# Patient Record
Sex: Female | Born: 1955 | Race: Black or African American | Hispanic: No | Marital: Married | Smoking: Former smoker
Health system: Southern US, Community
[De-identification: ages and names within clinical notes are randomized; demographics above are authoritative.]

## PROBLEM LIST (undated history)

## (undated) DIAGNOSIS — I10 Essential (primary) hypertension: Secondary | ICD-10-CM

## (undated) HISTORY — PX: APPENDECTOMY: SHX54

---

## 2003-01-05 ENCOUNTER — Encounter: Admission: RE | Admit: 2003-01-05 | Discharge: 2003-01-05 | Payer: Self-pay | Admitting: Family Medicine

## 2003-01-05 ENCOUNTER — Encounter: Payer: Self-pay | Admitting: Family Medicine

## 2005-06-12 ENCOUNTER — Emergency Department (HOSPITAL_COMMUNITY): Admission: EM | Admit: 2005-06-12 | Discharge: 2005-06-12 | Payer: Self-pay | Admitting: Emergency Medicine

## 2005-06-13 ENCOUNTER — Ambulatory Visit (HOSPITAL_COMMUNITY): Admission: RE | Admit: 2005-06-13 | Discharge: 2005-06-13 | Payer: Self-pay | Admitting: Emergency Medicine

## 2013-07-10 ENCOUNTER — Other Ambulatory Visit: Payer: Self-pay | Admitting: Obstetrics and Gynecology

## 2013-07-10 DIAGNOSIS — Z1231 Encounter for screening mammogram for malignant neoplasm of breast: Secondary | ICD-10-CM

## 2013-07-16 ENCOUNTER — Other Ambulatory Visit: Payer: Self-pay | Admitting: Gastroenterology

## 2013-07-16 DIAGNOSIS — R1310 Dysphagia, unspecified: Secondary | ICD-10-CM

## 2013-07-18 ENCOUNTER — Other Ambulatory Visit (HOSPITAL_COMMUNITY): Payer: Self-pay | Admitting: Diagnostic Radiology

## 2013-07-21 ENCOUNTER — Ambulatory Visit
Admission: RE | Admit: 2013-07-21 | Discharge: 2013-07-21 | Disposition: A | Discharge status: Home / Self Care | Payer: BC Managed Care – PPO | Source: Ambulatory Visit | Attending: Gastroenterology | Admitting: Gastroenterology

## 2013-07-21 DIAGNOSIS — R1310 Dysphagia, unspecified: Secondary | ICD-10-CM

## 2013-07-25 ENCOUNTER — Ambulatory Visit
Admission: RE | Admit: 2013-07-25 | Discharge: 2013-07-25 | Disposition: A | Discharge status: Home / Self Care | Payer: BC Managed Care – PPO | Source: Ambulatory Visit | Attending: Obstetrics and Gynecology | Admitting: Obstetrics and Gynecology

## 2013-07-25 DIAGNOSIS — Z1231 Encounter for screening mammogram for malignant neoplasm of breast: Secondary | ICD-10-CM

## 2013-10-13 ENCOUNTER — Encounter (HOSPITAL_COMMUNITY): Payer: Self-pay | Admitting: Pharmacist

## 2013-10-14 ENCOUNTER — Other Ambulatory Visit: Payer: Self-pay | Admitting: Obstetrics and Gynecology

## 2013-10-21 ENCOUNTER — Encounter (INDEPENDENT_AMBULATORY_CARE_PROVIDER_SITE_OTHER): Payer: Self-pay

## 2013-10-21 ENCOUNTER — Encounter (HOSPITAL_COMMUNITY): Payer: Self-pay

## 2013-10-21 ENCOUNTER — Encounter (HOSPITAL_COMMUNITY)
Admission: RE | Admit: 2013-10-21 | Discharge: 2013-10-21 | Disposition: A | Discharge status: Home / Self Care | Payer: BC Managed Care – PPO | Source: Ambulatory Visit | Attending: Obstetrics and Gynecology | Admitting: Obstetrics and Gynecology

## 2013-10-21 ENCOUNTER — Other Ambulatory Visit: Payer: Self-pay

## 2013-10-21 DIAGNOSIS — Z01810 Encounter for preprocedural cardiovascular examination: Secondary | ICD-10-CM | POA: Insufficient documentation

## 2013-10-21 DIAGNOSIS — Z01818 Encounter for other preprocedural examination: Secondary | ICD-10-CM | POA: Insufficient documentation

## 2013-10-21 DIAGNOSIS — Z01812 Encounter for preprocedural laboratory examination: Secondary | ICD-10-CM | POA: Insufficient documentation

## 2013-10-21 HISTORY — DX: Essential (primary) hypertension: I10

## 2013-10-21 LAB — BASIC METABOLIC PANEL
BUN: 11 mg/dL (ref 6–23)
CO2: 30 mEq/L (ref 19–32)
Chloride: 101 mEq/L (ref 96–112)
Glucose, Bld: 87 mg/dL (ref 70–99)

## 2013-10-21 LAB — CBC
HCT: 37.8 % (ref 36.0–46.0)
Hemoglobin: 12.8 g/dL (ref 12.0–15.0)
MCH: 27.5 pg (ref 26.0–34.0)
MCHC: 33.9 g/dL (ref 30.0–36.0)
MCV: 81.3 fL (ref 78.0–100.0)
RBC: 4.65 MIL/uL (ref 3.87–5.11)

## 2013-10-21 NOTE — Patient Instructions (Signed)
Your procedure is scheduled on:10/29/13  Enter through the Main Entrance at :10 am Pick up desk phone and dial [phone] and inform us of your arrival.  Please call [phone] if you have any problems the morning of surgery.  Remember: Do not eat food or drink liquids, including water, after midnight:Tuesday   You may brush your teeth the morning of surgery.   DO NOT wear jewelry, eye make-up, lipstick,body lotion, or dark fingernail polish.  (Polished toes are ok) You may wear deodorant.  If you are to be admitted after surgery, leave suitcase in car until your room has been assigned. Patients discharged on the day of surgery will not be allowed to drive home. Wear loose fitting, comfortable clothes for your ride home.

## 2013-10-27 NOTE — H&P (Signed)
  Admission History and Physical Exam for a Gynecology Patient  Ms. Alice Williamson is a 57 y.o. female who presents for hysteroscopy with resection of submucosal fibroid. She will also have a dilatation and curettage. She has been followed at the [facility]cology division of [facility]. The patient complains of postmenopausal bleeding. An endometrial biopsy was benign. Endometrial polyps were seen. A sono hysterogram showed a submucosal fibroid.   Past Medical History  Diagnosis Date  . Hypertension     No prescriptions prior to admission    Past Surgical History  Procedure Laterality Date  . Appendectomy      Allergies  Allergen Reactions  . Codeine Nausea And Vomiting    Family History: family history is not on file.  Social History:  reports that she has never smoked. She does not have any smokeless tobacco history on file. She reports that she drinks alcohol. She reports that she does not use illicit drugs.  Review of systems: See HPI.  Admission Physical Exam:    There is no height or weight on file to calculate BMI.  There were no vitals taken for this visit.  HEENT:                 Within normal limits Chest:                   Clear Heart:                    Regular rate and rhythm Breasts:                No masses, skin changes, bleeding, or discharge present Abdomen:             Nontender, no masses Extremities:          Grossly normal Neurologic exam: Grossly normal  Pelvic exam:  External genitalia: normal general appearance Vaginal: normal without tenderness, induration or masses Cervix: normal appearance Adnexa: normal bimanual exam Uterus: Upper limits normal size Rectal: no masses  Assessment:  Postmenopausal bleeding  Endometrial polyps  Submucosal fibroid  Hypertension  Plan:  The patient will undergo hysteroscopy with resection of submucosal fibroid. She will also have a dilatation and  curettage. She understands the indications for surgical procedure. She accepts the risk of, but not limited to, anesthetic complications, bleeding, infections, and possible damage to the surrounding organs.   Janine Limbo 10/27/2013

## 2013-10-29 ENCOUNTER — Encounter (HOSPITAL_COMMUNITY)
Admission: RE | Disposition: A | Discharge status: Home / Self Care | Payer: Self-pay | Source: Ambulatory Visit | Attending: Obstetrics and Gynecology

## 2013-10-29 ENCOUNTER — Ambulatory Visit (HOSPITAL_COMMUNITY)
Admission: RE | Admit: 2013-10-29 | Discharge: 2013-10-29 | Disposition: A | Discharge status: Home / Self Care | Payer: BC Managed Care – PPO | Source: Ambulatory Visit | Attending: Obstetrics and Gynecology | Admitting: Obstetrics and Gynecology

## 2013-10-29 ENCOUNTER — Encounter (HOSPITAL_COMMUNITY): Payer: BC Managed Care – PPO | Admitting: Anesthesiology

## 2013-10-29 ENCOUNTER — Encounter (HOSPITAL_COMMUNITY): Payer: Self-pay | Admitting: Registered Nurse

## 2013-10-29 ENCOUNTER — Ambulatory Visit (HOSPITAL_COMMUNITY): Payer: BC Managed Care – PPO | Admitting: Anesthesiology

## 2013-10-29 DIAGNOSIS — I10 Essential (primary) hypertension: Secondary | ICD-10-CM | POA: Insufficient documentation

## 2013-10-29 DIAGNOSIS — N840 Polyp of corpus uteri: Secondary | ICD-10-CM | POA: Insufficient documentation

## 2013-10-29 DIAGNOSIS — E669 Obesity, unspecified: Secondary | ICD-10-CM | POA: Insufficient documentation

## 2013-10-29 DIAGNOSIS — K0889 Other specified disorders of teeth and supporting structures: Secondary | ICD-10-CM | POA: Insufficient documentation

## 2013-10-29 DIAGNOSIS — D250 Submucous leiomyoma of uterus: Secondary | ICD-10-CM | POA: Insufficient documentation

## 2013-10-29 DIAGNOSIS — N950 Postmenopausal bleeding: Secondary | ICD-10-CM | POA: Insufficient documentation

## 2013-10-29 HISTORY — PX: DILATATION & CURRETTAGE/HYSTEROSCOPY WITH RESECTOCOPE: SHX5572

## 2013-10-29 SURGERY — DILATATION & CURETTAGE/HYSTEROSCOPY WITH RESECTOCOPE
Anesthesia: Spinal | Site: Vagina | Wound class: Clean Contaminated

## 2013-10-29 MED ORDER — IBUPROFEN 800 MG PO TABS: 800.0000 mg | ORAL_TABLET | Freq: Three times a day (TID) | ORAL | Status: AC | PRN

## 2013-10-29 MED ORDER — TRAMADOL HCL 50 MG PO TABS: 50.0000 mg | ORAL_TABLET | Freq: Four times a day (QID) | ORAL | Status: DC | PRN

## 2013-10-29 MED ORDER — FENTANYL CITRATE 0.05 MG/ML IJ SOLN
25.0000 ug | INTRAMUSCULAR | Status: DC | PRN
  Administered 2013-10-29: 50 ug via INTRAVENOUS
  Administered 2013-10-29: 25 ug via INTRAVENOUS

## 2013-10-29 MED ORDER — PROPOFOL 10 MG/ML IV EMUL
INTRAVENOUS | Status: AC
  Filled 2013-10-29: qty 20

## 2013-10-29 MED ORDER — FENTANYL CITRATE 0.05 MG/ML IJ SOLN
INTRAMUSCULAR | Status: AC
  Filled 2013-10-29: qty 2

## 2013-10-29 MED ORDER — BUPIVACAINE-EPINEPHRINE (PF) 0.5% -1:200000 IJ SOLN
INTRAMUSCULAR | Status: AC
  Filled 2013-10-29: qty 10

## 2013-10-29 MED ORDER — DEXAMETHASONE SODIUM PHOSPHATE 10 MG/ML IJ SOLN
INTRAMUSCULAR | Status: AC
  Filled 2013-10-29: qty 1

## 2013-10-29 MED ORDER — PHENYLEPHRINE HCL 10 MG/ML IJ SOLN
INTRAMUSCULAR | Status: DC | PRN
  Administered 2013-10-29 (×2): 80 ug via INTRAVENOUS

## 2013-10-29 MED ORDER — LIDOCAINE HCL (CARDIAC) 20 MG/ML IV SOLN
INTRAVENOUS | Status: AC
  Filled 2013-10-29: qty 5

## 2013-10-29 MED ORDER — LIDOCAINE HCL (CARDIAC) 20 MG/ML IV SOLN
INTRAVENOUS | Status: DC | PRN
  Administered 2013-10-29: 30 mg via INTRAVENOUS

## 2013-10-29 MED ORDER — PROPOFOL INFUSION 10 MG/ML OPTIME
INTRAVENOUS | Status: DC | PRN
  Administered 2013-10-29: 50 ug/kg/min via INTRAVENOUS

## 2013-10-29 MED ORDER — LIDOCAINE IN DEXTROSE 5-7.5 % IV SOLN
INTRAVENOUS | Status: AC
  Filled 2013-10-29: qty 2

## 2013-10-29 MED ORDER — LACTATED RINGERS IV SOLN
INTRAVENOUS | Status: DC
  Administered 2013-10-29 (×2): via INTRAVENOUS

## 2013-10-29 MED ORDER — PHENYLEPHRINE 40 MCG/ML (10ML) SYRINGE FOR IV PUSH (FOR BLOOD PRESSURE SUPPORT)
PREFILLED_SYRINGE | INTRAVENOUS | Status: AC
  Filled 2013-10-29: qty 5

## 2013-10-29 MED ORDER — ONDANSETRON HCL 4 MG/2ML IJ SOLN
INTRAMUSCULAR | Status: DC | PRN
  Administered 2013-10-29: 4 mg via INTRAVENOUS

## 2013-10-29 MED ORDER — MIDAZOLAM HCL 2 MG/2ML IJ SOLN
INTRAMUSCULAR | Status: AC
  Filled 2013-10-29: qty 2

## 2013-10-29 MED ORDER — MIDAZOLAM HCL 5 MG/5ML IJ SOLN
INTRAMUSCULAR | Status: DC | PRN
  Administered 2013-10-29: 2 mg via INTRAVENOUS

## 2013-10-29 MED ORDER — EPHEDRINE 5 MG/ML INJ
INTRAVENOUS | Status: AC
  Filled 2013-10-29: qty 10

## 2013-10-29 MED ORDER — KETOROLAC TROMETHAMINE 30 MG/ML IJ SOLN
INTRAMUSCULAR | Status: AC
  Filled 2013-10-29: qty 2

## 2013-10-29 MED ORDER — GLYCINE 1.5 % IR SOLN
Status: DC | PRN
  Administered 2013-10-29: 3000 mL

## 2013-10-29 MED ORDER — DEXAMETHASONE SODIUM PHOSPHATE 10 MG/ML IJ SOLN
INTRAMUSCULAR | Status: DC | PRN
  Administered 2013-10-29: 10 mg via INTRAVENOUS

## 2013-10-29 MED ORDER — EPHEDRINE SULFATE 50 MG/ML IJ SOLN
INTRAMUSCULAR | Status: DC | PRN
  Administered 2013-10-29: 10 mg via INTRAVENOUS

## 2013-10-29 MED ORDER — ONDANSETRON HCL 4 MG/2ML IJ SOLN
INTRAMUSCULAR | Status: AC
Start: 2013-10-29 — End: 2013-10-29
  Filled 2013-10-29: qty 2

## 2013-10-29 MED ORDER — FENTANYL CITRATE 0.05 MG/ML IJ SOLN
INTRAMUSCULAR | Status: AC
  Administered 2013-10-29: 25 ug via INTRAVENOUS
  Filled 2013-10-29: qty 2

## 2013-10-29 MED ORDER — KETOROLAC TROMETHAMINE 30 MG/ML IJ SOLN
INTRAMUSCULAR | Status: DC | PRN
  Administered 2013-10-29: 30 mg via INTRAMUSCULAR
  Administered 2013-10-29: 30 mg via INTRAVENOUS

## 2013-10-29 MED ORDER — FENTANYL CITRATE 0.05 MG/ML IJ SOLN
INTRAMUSCULAR | Status: DC | PRN
  Administered 2013-10-29 (×2): 50 ug via INTRAVENOUS

## 2013-10-29 SURGICAL SUPPLY — 19 items
CANISTER SUCT 3000ML (MISCELLANEOUS) ×2 IMPLANT
CATH ROBINSON RED A/P 16FR (CATHETERS) ×2 IMPLANT
CLOTH BEACON ORANGE TIMEOUT ST (SAFETY) ×2 IMPLANT
CONTAINER PREFILL 10% NBF 60ML (FORM) ×3 IMPLANT
DRAPE HYSTEROSCOPY (DRAPE) ×1 IMPLANT
DRAPE PROXIMA HALF (DRAPES) ×1 IMPLANT
DRESSING TELFA 8X3 (GAUZE/BANDAGES/DRESSINGS) ×2 IMPLANT
ELECT REM PT RETURN 9FT ADLT (ELECTROSURGICAL) ×2
ELECTRODE REM PT RTRN 9FT ADLT (ELECTROSURGICAL) IMPLANT
GLOVE BIOGEL PI IND STRL 8.5 (GLOVE) ×1 IMPLANT
GLOVE BIOGEL PI INDICATOR 8.5 (GLOVE) ×1
GLOVE ECLIPSE 8.0 STRL XLNG CF (GLOVE) ×4 IMPLANT
GOWN STRL REIN XL XLG (GOWN DISPOSABLE) ×4 IMPLANT
LOOP ANGLED CUTTING 22FR (CUTTING LOOP) IMPLANT
PACK HYSTEROSCOPY LF (CUSTOM PROCEDURE TRAY) ×1 IMPLANT
PACK VAGINAL MINOR WOMEN LF (CUSTOM PROCEDURE TRAY) ×1 IMPLANT
PAD OB MATERNITY 4.3X12.25 (PERSONAL CARE ITEMS) ×2 IMPLANT
TOWEL OR 17X24 6PK STRL BLUE (TOWEL DISPOSABLE) ×4 IMPLANT
WATER STERILE IRR 1000ML POUR (IV SOLUTION) ×2 IMPLANT

## 2013-10-29 NOTE — Preoperative (Signed)
Beta Blockers   Reason not to administer Beta Blockers:Not Applicable 

## 2013-10-29 NOTE — Anesthesia Postprocedure Evaluation (Signed)
  Anesthesia Post-op Note  Patient: Alice Williamson  Procedure(s) Performed: Procedure(s): DILATATION & CURETTAGE/HYSTEROSCOPY WITH RESECTION OF POLYP AND FIBROID (N/A)  Patient is awake, responsive, moving her legs, and has signs of resolution of her numbness. Pain and nausea are reasonably well controlled. Vital signs are stable and clinically acceptable. Oxygen saturation is clinically acceptable. There are no apparent anesthetic complications at this time. Patient is ready for discharge.

## 2013-10-29 NOTE — Op Note (Signed)
OPERATIVE NOTE  Alice Williamson  DOB:    [birth year]  MRN:    [id number]  CSN:    [id number]  Date of Surgery:  10/29/2013  Preoperative Diagnosis:  Postmenopausal bleeding  Submucosal polyp  Submucosal fibroid  Overweight  Loose tooth  Postoperative Diagnosis:  Same  Procedure:  Hysteroscopy with resection of endometrial polyps and submucosal fibroid Dilatation and curettage  Surgeon:  Leonard Schwartz, M.D.  Assistant:  None  Anesthetic:  Spinal (because of loose tooth)  Disposition:  The patient is a 57 y.o.-year-old female who presents with the above-mentioned diagnosis. She understands the indications for her surgical procedure. She accepts the risk of, but not limited to, anesthetic complications, bleeding, infections, and possible damage to the surrounding organs.  Findings:  On examination under anesthesia the uterus was upper limits normal size size. No adnexal masses were appreciated. No parametrial disease was appreciated. The uterus sounded to 6 cm. The patient was noted to have an endometrial polyp at the fundus of the uterus. She was also noted to have a submucosal fibroid measuring approximately 1 cm from the left lateral inner portion of the uterus.  Procedure:  The patient was taken to the operating room where a spinal anesthetic was given. The perineum and vagina were prepped with Betadine. The bladder was drained of urine. The patient was sterilely draped. Examination under anesthesia was performed. An endocervical curettage was performed. The cervix was gently dilated. The diagnostic hysteroscope was inserted and the cavity was carefully inspected. Pictures were taken. Findings included: An endometrial polyp and a submucosal fibroid. The diagnostic hysteroscope was removed. The cervix was dilated further. The operative hysteroscope was inserted. The polyp and submucosal fibroid were resected using a single loop. The cavity was then curetted  using a sharp curet. The cavity was felt to be clean at the end of our procedure. Hemostasis was adequate. All instruments were removed. The examination was repeated and the uterus was noted to be firm. Sponge, and needle counts were correct. The estimated blood loss for the procedure was 10 cc's. The estimated fluid deficit loss 470 cc. The patient was awakened from her anesthetic without difficulty. She was returned to the supine position and and transported to the recovery room in stable condition. The endocervical curettings, endometrial resections, and endometrial curettings were sent to pathology.  Followup instructions:  The patient will return to see Dr. Stefano Gaul in 2 weeks. She was given a copy of the postoperative instructions for patients who've undergone hysteroscopy.  Discharge medications:  Motrin 800 mg every 8 hours as needed for mild to moderate pain. Tramadol 50 mg tablet every 4 hours as needed for severe pain.  Leonard Schwartz, M.D.

## 2013-10-29 NOTE — Anesthesia Preprocedure Evaluation (Addendum)
Anesthesia Evaluation  Patient identified by MRN, date of birth, ID band Patient awake    Reviewed: Allergy & Precautions, H&P , Patient's Chart, lab work & pertinent test results, reviewed documented beta blocker date and time   Airway Mallampati: II TM Distance: >3 FB Neck ROM: full    Dental no notable dental hx. (+) Loose,    Pulmonary  breath sounds clear to auscultation  Pulmonary exam normal       Cardiovascular hypertension, On Medications Rhythm:regular Rate:Normal     Neuro/Psych    GI/Hepatic   Endo/Other    Renal/GU      Musculoskeletal   Abdominal   Peds  Hematology   Anesthesia Other Findings   Reproductive/Obstetrics                          Anesthesia Physical Anesthesia Plan  ASA: II  Anesthesia Plan: Spinal   Post-op Pain Management:    Induction:   Airway Management Planned:   Additional Equipment:   Intra-op Plan:   Post-operative Plan:   Informed Consent: I have reviewed the patients History and Physical, chart, labs and discussed the procedure including the risks, benefits and alternatives for the proposed anesthesia with the patient or authorized representative who has indicated his/her understanding and acceptance.   Dental Advisory Given and Dental advisory given  Plan Discussed with: CRNA and Surgeon  Anesthesia Plan Comments: (Lab work confirmed with CRNA in room. Platelets okay. Discussed spinal anesthetic, and patient consents to the procedure:  included risk of possible headache,backache, failed block, allergic reaction, and nerve injury. This patient was asked if she had any questions or concerns before the procedure started. )       Anesthesia Quick Evaluation

## 2013-10-29 NOTE — H&P (Signed)
The patient was interviewed and examined today.  The previously documented history and physical examination was reviewed. There are no changes. The operative procedure was reviewed. The risks and benefits were outlined again. The specific risks include, but are not limited to, anesthetic complications, bleeding, infections, and possible damage to the surrounding organs. The patient's questions were answered.  We are ready to proceed as outlined. The likelihood of the patient achieving the goals of this procedure is very likely.   BP 112/70  Pulse 62  Temp(Src) 97.9 F (36.6 C) (Oral)  Resp 16  SpO2 100%  CBC    Component Value Date/Time   WBC 5.0 10/21/2013 1220   RBC 4.65 10/21/2013 1220   HGB 12.8 10/21/2013 1220   HCT 37.8 10/21/2013 1220   PLT 260 10/21/2013 1220   MCV 81.3 10/21/2013 1220   MCH 27.5 10/21/2013 1220   MCHC 33.9 10/21/2013 1220   RDW 13.1 10/21/2013 1220   Leonard Schwartz, M.D.

## 2013-10-29 NOTE — Transfer of Care (Signed)
Immediate Anesthesia Transfer of Care Note  Patient: Alice Williamson  Procedure(s) Performed: Procedure(s): DILATATION & CURETTAGE/HYSTEROSCOPY WITH RESECTION OF POLYP AND FIBROID (N/A)  Patient Location: PACU  Anesthesia Type:Spinal  Level of Consciousness: awake, alert , oriented and patient cooperative  Airway & Oxygen Therapy: Patient Spontanous Breathing  Post-op Assessment: Report given to PACU RN and Post -op Vital signs reviewed and stable  Post vital signs: stable  Complications: No apparent anesthesia complications

## 2013-10-29 NOTE — Anesthesia Procedure Notes (Signed)
Spinal  Patient location during procedure: OR Preanesthetic Checklist Completed: patient identified, site marked, surgical consent, pre-op evaluation, timeout performed, IV checked, risks and benefits discussed and monitors and equipment checked Spinal Block Patient position: sitting Prep: DuraPrep Patient monitoring: heart rate, cardiac monitor, continuous pulse ox and blood pressure Approach: midline Location: L3-4 Injection technique: single-shot Needle Needle type: Sprotte  Needle gauge: 24 G Needle length: 9 cm Assessment Sensory level: T4 Additional Notes Spinal dose 60 mg Xylocaine

## 2013-10-30 ENCOUNTER — Encounter (HOSPITAL_COMMUNITY): Payer: Self-pay | Admitting: Obstetrics and Gynecology

## 2015-01-04 ENCOUNTER — Other Ambulatory Visit: Payer: Self-pay | Admitting: Orthopedic Surgery

## 2015-01-06 ENCOUNTER — Encounter (HOSPITAL_BASED_OUTPATIENT_CLINIC_OR_DEPARTMENT_OTHER): Payer: Self-pay | Admitting: *Deleted

## 2015-01-06 NOTE — Progress Notes (Signed)
Pt needs to come in for ekg-bmet-she is saying she may r/s due to weather coming

## 2015-01-18 ENCOUNTER — Encounter (HOSPITAL_BASED_OUTPATIENT_CLINIC_OR_DEPARTMENT_OTHER)
Admission: RE | Admit: 2015-01-18 | Discharge: 2015-01-18 | Disposition: A | Discharge status: Home / Self Care | Payer: 59 | Source: Ambulatory Visit | Attending: Orthopedic Surgery | Admitting: Orthopedic Surgery

## 2015-01-18 DIAGNOSIS — M6752 Plica syndrome, left knee: Secondary | ICD-10-CM | POA: Diagnosis not present

## 2015-01-18 DIAGNOSIS — I10 Essential (primary) hypertension: Secondary | ICD-10-CM | POA: Diagnosis not present

## 2015-01-18 DIAGNOSIS — M6751 Plica syndrome, right knee: Secondary | ICD-10-CM | POA: Diagnosis not present

## 2015-01-18 DIAGNOSIS — M94261 Chondromalacia, right knee: Secondary | ICD-10-CM | POA: Diagnosis not present

## 2015-01-18 DIAGNOSIS — M170 Bilateral primary osteoarthritis of knee: Secondary | ICD-10-CM | POA: Diagnosis not present

## 2015-01-18 DIAGNOSIS — M23222 Derangement of posterior horn of medial meniscus due to old tear or injury, left knee: Secondary | ICD-10-CM | POA: Diagnosis not present

## 2015-01-18 DIAGNOSIS — M23200 Derangement of unspecified lateral meniscus due to old tear or injury, right knee: Secondary | ICD-10-CM | POA: Diagnosis not present

## 2015-01-18 LAB — BASIC METABOLIC PANEL
Anion gap: 7 (ref 5–15)
BUN: 11 mg/dL (ref 6–23)
CO2: 28 mmol/L (ref 19–32)
Calcium: 9.5 mg/dL (ref 8.4–10.5)
Chloride: 105 mmol/L (ref 96–112)
Creatinine, Ser: 1.06 mg/dL (ref 0.50–1.10)
GFR calc Af Amer: 66 mL/min — ABNORMAL LOW (ref >90)
GFR, EST NON AFRICAN AMERICAN: 57 mL/min — AB (ref >90)
Glucose, Bld: 97 mg/dL (ref 70–99)
Potassium: 4.7 mmol/L (ref 3.5–5.1)
Sodium: 140 mmol/L (ref 135–145)

## 2015-01-18 NOTE — Progress Notes (Signed)
surg chg due to weather-to bring all meds and ins card To come in for ekg bmet

## 2015-01-20 ENCOUNTER — Ambulatory Visit (HOSPITAL_BASED_OUTPATIENT_CLINIC_OR_DEPARTMENT_OTHER)
Admission: RE | Admit: 2015-01-20 | Discharge: 2015-01-20 | Disposition: A | Discharge status: Home / Self Care | Payer: 59 | Source: Ambulatory Visit | Attending: Orthopedic Surgery | Admitting: Orthopedic Surgery

## 2015-01-20 ENCOUNTER — Encounter (HOSPITAL_BASED_OUTPATIENT_CLINIC_OR_DEPARTMENT_OTHER)
Admission: RE | Disposition: A | Discharge status: Home / Self Care | Payer: Self-pay | Source: Ambulatory Visit | Attending: Orthopedic Surgery

## 2015-01-20 ENCOUNTER — Ambulatory Visit (HOSPITAL_BASED_OUTPATIENT_CLINIC_OR_DEPARTMENT_OTHER): Payer: 59 | Admitting: Certified Registered"

## 2015-01-20 ENCOUNTER — Encounter (HOSPITAL_BASED_OUTPATIENT_CLINIC_OR_DEPARTMENT_OTHER): Payer: Self-pay | Admitting: *Deleted

## 2015-01-20 DIAGNOSIS — M23200 Derangement of unspecified lateral meniscus due to old tear or injury, right knee: Secondary | ICD-10-CM | POA: Insufficient documentation

## 2015-01-20 DIAGNOSIS — M94261 Chondromalacia, right knee: Secondary | ICD-10-CM | POA: Insufficient documentation

## 2015-01-20 DIAGNOSIS — M170 Bilateral primary osteoarthritis of knee: Secondary | ICD-10-CM | POA: Insufficient documentation

## 2015-01-20 DIAGNOSIS — M23222 Derangement of posterior horn of medial meniscus due to old tear or injury, left knee: Secondary | ICD-10-CM | POA: Insufficient documentation

## 2015-01-20 DIAGNOSIS — M6751 Plica syndrome, right knee: Secondary | ICD-10-CM | POA: Insufficient documentation

## 2015-01-20 DIAGNOSIS — I10 Essential (primary) hypertension: Secondary | ICD-10-CM | POA: Insufficient documentation

## 2015-01-20 DIAGNOSIS — M6752 Plica syndrome, left knee: Secondary | ICD-10-CM | POA: Insufficient documentation

## 2015-01-20 HISTORY — PX: CHONDROPLASTY: SHX5177

## 2015-01-20 HISTORY — PX: KNEE ARTHROSCOPY WITH MEDIAL MENISECTOMY: SHX5651

## 2015-01-20 HISTORY — PX: KNEE ARTHROSCOPY WITH EXCISION PLICA: SHX5647

## 2015-01-20 HISTORY — PX: KNEE ARTHROSCOPY WITH LATERAL MENISECTOMY: SHX6193

## 2015-01-20 LAB — POCT HEMOGLOBIN-HEMACUE: Hemoglobin: 13.1 g/dL (ref 12.0–15.0)

## 2015-01-20 SURGERY — ARTHROSCOPY, KNEE, WITH MEDIAL MENISCECTOMY
Anesthesia: General | Site: Knee | Laterality: Right

## 2015-01-20 MED ORDER — FENTANYL CITRATE 0.05 MG/ML IJ SOLN
INTRAMUSCULAR | Status: AC
  Filled 2015-01-20: qty 6

## 2015-01-20 MED ORDER — PROPOFOL 10 MG/ML IV BOLUS
INTRAVENOUS | Status: DC | PRN
  Administered 2015-01-20: 240 mg via INTRAVENOUS

## 2015-01-20 MED ORDER — MIDAZOLAM HCL 2 MG/2ML IJ SOLN
INTRAMUSCULAR | Status: AC
  Filled 2015-01-20: qty 2

## 2015-01-20 MED ORDER — CEFAZOLIN SODIUM 1-5 GM-% IV SOLN
INTRAVENOUS | Status: AC
  Filled 2015-01-20: qty 100

## 2015-01-20 MED ORDER — FENTANYL CITRATE 0.05 MG/ML IJ SOLN: 50.0000 ug | INTRAMUSCULAR | Status: DC | PRN

## 2015-01-20 MED ORDER — DEXAMETHASONE SODIUM PHOSPHATE 10 MG/ML IJ SOLN
INTRAMUSCULAR | Status: DC | PRN
  Administered 2015-01-20: 10 mg via INTRAVENOUS

## 2015-01-20 MED ORDER — CEFAZOLIN SODIUM-DEXTROSE 2-3 GM-% IV SOLR
2.0000 g | INTRAVENOUS | Status: AC
  Administered 2015-01-20: 2 g via INTRAVENOUS

## 2015-01-20 MED ORDER — HYDROMORPHONE HCL 1 MG/ML IJ SOLN
INTRAMUSCULAR | Status: AC
  Filled 2015-01-20: qty 1

## 2015-01-20 MED ORDER — OXYCODONE-ACETAMINOPHEN 5-325 MG PO TABS: 1.0000 | ORAL_TABLET | ORAL | Status: AC | PRN

## 2015-01-20 MED ORDER — BUPIVACAINE HCL (PF) 0.5 % IJ SOLN
INTRAMUSCULAR | Status: DC | PRN
  Administered 2015-01-20: 20 mL

## 2015-01-20 MED ORDER — OXYCODONE HCL 5 MG PO TABS
ORAL_TABLET | ORAL | Status: AC
  Filled 2015-01-20: qty 1

## 2015-01-20 MED ORDER — LIDOCAINE HCL (CARDIAC) 20 MG/ML IV SOLN
INTRAVENOUS | Status: DC | PRN
  Administered 2015-01-20: 30 mg via INTRAVENOUS

## 2015-01-20 MED ORDER — CHLORHEXIDINE GLUCONATE 4 % EX LIQD: 60.0000 mL | Freq: Once | CUTANEOUS | Status: DC

## 2015-01-20 MED ORDER — LACTATED RINGERS IV SOLN
INTRAVENOUS | Status: DC
  Administered 2015-01-20 (×2): via INTRAVENOUS

## 2015-01-20 MED ORDER — HYDROMORPHONE HCL 1 MG/ML IJ SOLN
0.2500 mg | INTRAMUSCULAR | Status: DC | PRN
  Administered 2015-01-20 (×3): 0.5 mg via INTRAVENOUS

## 2015-01-20 MED ORDER — BUPIVACAINE HCL (PF) 0.5 % IJ SOLN
INTRAMUSCULAR | Status: AC
  Filled 2015-01-20: qty 30

## 2015-01-20 MED ORDER — MIDAZOLAM HCL 2 MG/2ML IJ SOLN: 1.0000 mg | INTRAMUSCULAR | Status: DC | PRN

## 2015-01-20 MED ORDER — OXYCODONE HCL 5 MG PO TABS
5.0000 mg | ORAL_TABLET | Freq: Once | ORAL | Status: AC | PRN
  Administered 2015-01-20: 5 mg via ORAL

## 2015-01-20 MED ORDER — OXYCODONE HCL 5 MG/5ML PO SOLN: 5.0000 mg | Freq: Once | ORAL | Status: AC | PRN

## 2015-01-20 MED ORDER — FENTANYL CITRATE 0.05 MG/ML IJ SOLN
INTRAMUSCULAR | Status: DC | PRN
  Administered 2015-01-20 (×2): 25 ug via INTRAVENOUS
  Administered 2015-01-20: 50 ug via INTRAVENOUS
  Administered 2015-01-20 (×2): 25 ug via INTRAVENOUS

## 2015-01-20 MED ORDER — EPHEDRINE SULFATE 50 MG/ML IJ SOLN
INTRAMUSCULAR | Status: DC | PRN
  Administered 2015-01-20: 10 mg via INTRAVENOUS

## 2015-01-20 MED ORDER — ONDANSETRON HCL 4 MG/2ML IJ SOLN: 4.0000 mg | Freq: Once | INTRAMUSCULAR | Status: DC | PRN

## 2015-01-20 MED ORDER — ONDANSETRON HCL 4 MG/2ML IJ SOLN
INTRAMUSCULAR | Status: DC | PRN
  Administered 2015-01-20: 4 mg via INTRAVENOUS

## 2015-01-20 SURGICAL SUPPLY — 40 items
BANDAGE ELASTIC 6 VELCRO ST LF (GAUZE/BANDAGES/DRESSINGS) ×10 IMPLANT
BLADE 4.2CUDA (BLADE) IMPLANT
BLADE GREAT WHITE 4.2 (BLADE) ×4 IMPLANT
BLADE GREAT WHITE 4.2MM (BLADE) ×1
CUTTER MENISCUS  4.2MM (BLADE)
CUTTER MENISCUS 4.2MM (BLADE) IMPLANT
DRAPE ARTHROSCOPY W/POUCH 114 (DRAPES) ×10 IMPLANT
DRSG EMULSION OIL 3X3 NADH (GAUZE/BANDAGES/DRESSINGS) ×10 IMPLANT
DURAPREP 26ML APPLICATOR (WOUND CARE) ×10 IMPLANT
ELECT MENISCUS 165MM 90D (ELECTRODE) IMPLANT
ELECT REM PT RETURN 9FT ADLT (ELECTROSURGICAL)
ELECTRODE REM PT RTRN 9FT ADLT (ELECTROSURGICAL) IMPLANT
GAUZE SPONGE 4X4 12PLY STRL (GAUZE/BANDAGES/DRESSINGS) ×5 IMPLANT
GLOVE BIO SURGEON STRL SZ7 (GLOVE) ×3 IMPLANT
GLOVE BIOGEL PI IND STRL 7.0 (GLOVE) ×1 IMPLANT
GLOVE BIOGEL PI IND STRL 8 (GLOVE) ×6 IMPLANT
GLOVE BIOGEL PI INDICATOR 7.0 (GLOVE) ×2
GLOVE BIOGEL PI INDICATOR 8 (GLOVE) ×4
GLOVE ECLIPSE 6.5 STRL STRAW (GLOVE) ×6 IMPLANT
GLOVE ECLIPSE 7.5 STRL STRAW (GLOVE) ×10 IMPLANT
GOWN STRL REUS W/ TWL LRG LVL3 (GOWN DISPOSABLE) ×3 IMPLANT
GOWN STRL REUS W/ TWL XL LVL3 (GOWN DISPOSABLE) ×3 IMPLANT
GOWN STRL REUS W/TWL LRG LVL3 (GOWN DISPOSABLE) ×5
GOWN STRL REUS W/TWL XL LVL3 (GOWN DISPOSABLE) ×10 IMPLANT
HOLDER KNEE FOAM BLUE (MISCELLANEOUS) ×4 IMPLANT
KNEE WRAP E Z 3 GEL PACK (MISCELLANEOUS) ×10 IMPLANT
MANIFOLD NEPTUNE II (INSTRUMENTS) IMPLANT
NDL SAFETY ECLIPSE 18X1.5 (NEEDLE) IMPLANT
NEEDLE HYPO 18GX1.5 SHARP (NEEDLE)
PACK ARTHROSCOPY DSU (CUSTOM PROCEDURE TRAY) ×8 IMPLANT
PACK BASIN DAY SURGERY FS (CUSTOM PROCEDURE TRAY) ×5 IMPLANT
PAD CAST 4YDX4 CTTN HI CHSV (CAST SUPPLIES) ×6 IMPLANT
PADDING CAST COTTON 4X4 STRL (CAST SUPPLIES) ×10
PENCIL BUTTON HOLSTER BLD 10FT (ELECTRODE) IMPLANT
SET ARTHROSCOPY TUBING (MISCELLANEOUS) ×5
SET ARTHROSCOPY TUBING LN (MISCELLANEOUS) ×3 IMPLANT
SUT ETHILON 4 0 PS 2 18 (SUTURE) IMPLANT
SYR 5ML LL (SYRINGE) ×5 IMPLANT
TOWEL OR 17X24 6PK STRL BLUE (TOWEL DISPOSABLE) ×5 IMPLANT
TOWEL OR NON WOVEN STRL DISP B (DISPOSABLE) ×5 IMPLANT

## 2015-01-20 NOTE — H&P (Signed)
PREOPERATIVE H&P  Chief Complaint: bilateral knee pain  HPI: Alice Williamson is a 59 y.o. female who presents for evaluation of bilateral knee pain. It has been present for greater than 3 months and has been worsening.  Bilateral MRI's show med meniscal taers and djd She has failed conservative measures. Pain is rated as moderate.  Past Medical History  Diagnosis Date  . Hypertension    Past Surgical History  Procedure Laterality Date  . Appendectomy    . Dilatation & currettage/hysteroscopy with resectocope N/A 10/29/2013    Procedure: DILATATION & CURETTAGE/HYSTEROSCOPY WITH RESECTION OF POLYP AND FIBROID;  Surgeon: [name], MD;  Location: [facility];  Service: Gynecology;  Laterality: N/A;   History   Social History  . Marital Status: Married    Spouse Name: N/A    Number of Children: N/A  . Years of Education: N/A   Social History Main Topics  . Smoking status: Never Smoker   . Smokeless tobacco: None  . Alcohol Use: Yes     Comment: sociaaly  . Drug Use: No  . Sexual Activity: None   Other Topics Concern  . None   Social History Narrative   History reviewed. No pertinent family history. Allergies  Allergen Reactions  . Codeine Nausea And Vomiting   Prior to Admission medications   Medication Sig Start Date End Date Taking? Authorizing Provider  acetaminophen (TYLENOL) 500 MG tablet Take 500 mg by mouth every 6 (six) hours as needed.   Yes Historical Provider, MD  diphenhydrAMINE (BENADRYL) 25 MG tablet Take 25 mg by mouth every 6 (six) hours as needed.   Yes Historical Provider, MD  lisinopril-hydrochlorothiazide (PRINZIDE,ZESTORETIC) 10-12.5 MG per tablet Take 1 tablet by mouth daily.   Yes Historical Provider, MD  ibuprofen (ADVIL,MOTRIN) 800 MG tablet Take 1 tablet (800 mg total) by mouth every 8 (eight) hours as needed. 10/29/13   [name], MD     Positive ROS: none  All other systems have been reviewed and were otherwise negative with the  exception of those mentioned in the HPI and as above.  Physical Exam: Filed Vitals:   01/20/15 1214  BP: 116/64  Pulse: 64  Temp: 97.9 F (36.6 C)  Resp: 16    General: Alert, no acute distress Cardiovascular: No pedal edema Respiratory: No cyanosis, no use of accessory musculature GI: No organomegaly, abdomen is soft and non-tender Skin: No lesions in the area of chief complaint Neurologic: Sensation intact distally Psychiatric: Patient is competent for consent with normal mood and affect Lymphatic: No axillary or cervical lymphadenopathy  MUSCULOSKELETAL: Bilat Knees: med jt line tender//+ mcmurray// - Instability  Assessment/Plan: medial meniscus tears and bilateral degenerative joint disease Plan for Procedure(s): ARTHROSCOPY KNEE BILATERAL  The risks benefits and alternatives were discussed with the patient including but not limited to the risks of nonoperative treatment, versus surgical intervention including infection, bleeding, nerve injury, malunion, nonunion, hardware prominence, hardware failure, need for hardware removal, blood clots, cardiopulmonary complications, morbidity, mortality, among others, and they were willing to proceed.  Predicted outcome is good, although there will be at least a six to nine month expected recovery.  Isidor Bromell L, MD 01/20/2015 1:18 PM

## 2015-01-20 NOTE — Anesthesia Procedure Notes (Signed)
Procedure Name: LMA Insertion Date/Time: 01/20/2015 1:35 PM Performed by: Bianca Raneri Pre-anesthesia Checklist: Patient identified, Emergency Drugs available, Suction available and Patient being monitored Patient Re-evaluated:Patient Re-evaluated prior to inductionOxygen Delivery Method: Circle System Utilized Preoxygenation: Pre-oxygenation with 100% oxygen Intubation Type: IV induction Ventilation: Mask ventilation without difficulty LMA: LMA inserted LMA Size: 4.0 Number of attempts: 1 Airway Equipment and Method: Bite block Placement Confirmation: positive ETCO2 Tube secured with: Tape Dental Injury: Teeth and Oropharynx as per pre-operative assessment

## 2015-01-20 NOTE — Brief Op Note (Signed)
01/20/2015  2:29 PM  PATIENT:  Salvadore Farber  59 y.o. female  PRE-OPERATIVE DIAGNOSIS:  medial meniscus tears and bilateral degenerative joint disease  POST-OPERATIVE DIAGNOSIS:  bilateral meniscus tears and degenerative joint disease  PROCEDURE:  Procedure(s): KNEE ARTHROSCOPY WITH MEDIAL MENISECTOMY (Bilateral) KNEE ARTHROSCOPY WITH LATERAL MENISECTOMY (Bilateral) CHONDROPLASTY (Right) KNEE ARTHROSCOPY WITH EXCISION PLICA (Bilateral)  SURGEON:  Surgeon(s) and Role:    * [name], MD - Primary  PHYSICIAN ASSISTANT:   ASSISTANTS: bethune   ANESTHESIA:   general  EBL:  Total I/O In: 1000 [I.V.:1000] Out: -   BLOOD ADMINISTERED:none  DRAINS: none   LOCAL MEDICATIONS USED:  MARCAINE     SPECIMEN:  No Specimen  DISPOSITION OF SPECIMEN:  N/A  COUNTS:  YES  TOURNIQUET:  * No tourniquets in log *  DICTATION: .Other Dictation: Dictation Number [id number]  PLAN OF CARE: Discharge to home after PACU  PATIENT DISPOSITION:  PACU - hemodynamically stable.   Delay start of Pharmacological VTE agent (>24hrs) due to surgical blood loss or risk of bleeding: no

## 2015-01-20 NOTE — Transfer of Care (Signed)
Immediate Anesthesia Transfer of Care Note  Patient: Alice Williamson  Procedure(s) Performed: Procedure(s): KNEE ARTHROSCOPY WITH MEDIAL MENISECTOMY (Bilateral) KNEE ARTHROSCOPY WITH LATERAL MENISECTOMY (Bilateral) CHONDROPLASTY (Right) KNEE ARTHROSCOPY WITH EXCISION PLICA (Bilateral)  Patient Location: PACU  Anesthesia Type:General  Level of Consciousness: awake, sedated and patient cooperative  Airway & Oxygen Therapy: Patient Spontanous Breathing and Patient connected to face mask oxygen  Post-op Assessment: Report given to RN and Post -op Vital signs reviewed and stable  Post vital signs: Reviewed and stable  Last Vitals:  Filed Vitals:   01/20/15 1214  BP: 116/64  Pulse: 64  Temp: 36.6 C  Resp: 16    Complications: No apparent anesthesia complications

## 2015-01-20 NOTE — Anesthesia Preprocedure Evaluation (Signed)
Anesthesia Evaluation  Patient identified by MRN, date of birth, ID band Patient awake    Reviewed: Allergy & Precautions, NPO status , Patient's Chart, lab work & pertinent test results  Airway Mallampati: I  TM Distance: >3 FB Neck ROM: Full    Dental  (+) Teeth Intact, Dental Advisory Given   Pulmonary  breath sounds clear to auscultation        Cardiovascular hypertension, Pt. on medications Rhythm:Regular Rate:Normal     Neuro/Psych    GI/Hepatic   Endo/Other    Renal/GU      Musculoskeletal   Abdominal   Peds  Hematology   Anesthesia Other Findings   Reproductive/Obstetrics                             Anesthesia Physical Anesthesia Plan  ASA: I  Anesthesia Plan: General   Post-op Pain Management:    Induction: Intravenous  Airway Management Planned: LMA  Additional Equipment:   Intra-op Plan:   Post-operative Plan: Extubation in OR  Informed Consent: I have reviewed the patients History and Physical, chart, labs and discussed the procedure including the risks, benefits and alternatives for the proposed anesthesia with the patient or authorized representative who has indicated his/her understanding and acceptance.   Dental advisory given  Plan Discussed with: CRNA, Anesthesiologist and Surgeon  Anesthesia Plan Comments:         Anesthesia Quick Evaluation

## 2015-01-20 NOTE — Anesthesia Postprocedure Evaluation (Signed)
  Anesthesia Post-op Note  Patient: Alice Williamson  Procedure(s) Performed: Procedure(s): KNEE ARTHROSCOPY WITH MEDIAL MENISECTOMY (Bilateral) KNEE ARTHROSCOPY WITH LATERAL MENISECTOMY (Bilateral) CHONDROPLASTY (Right) KNEE ARTHROSCOPY WITH EXCISION PLICA (Bilateral)  Patient Location: PACU  Anesthesia Type:General  Level of Consciousness: awake and alert   Airway and Oxygen Therapy: Patient Spontanous Breathing  Post-op Pain: none  Post-op Assessment: Post-op Vital signs reviewed  Post-op Vital Signs: Reviewed  Last Vitals:  Filed Vitals:   01/20/15 1515  BP: 100/65  Pulse: 81  Temp:   Resp: 20    Complications: No apparent anesthesia complications

## 2015-01-20 NOTE — Discharge Instructions (Signed)

## 2015-01-21 ENCOUNTER — Encounter (HOSPITAL_BASED_OUTPATIENT_CLINIC_OR_DEPARTMENT_OTHER): Payer: Self-pay | Admitting: Orthopedic Surgery

## 2015-01-21 NOTE — Op Note (Signed)
Alice Williamson, Alice Williamson               ACCOUNT NO.:  [id number]  MEDICAL RECORD NO.:  [id number]  LOCATION:                                 FACILITY:  PHYSICIAN:  [name], M.D.   DATE OF BIRTH:  05/28/1956  DATE OF PROCEDURE:  01/20/2015 DATE OF DISCHARGE:  01/20/2015                              OPERATIVE REPORT   PREOPERATIVE DIAGNOSES: 1. Medial and lateral meniscus tears, right knee. 2. Chondromalacia, medial compartment, right knee. 3. Left knee medial meniscal tear.  POSTOPERATIVE DIAGNOSES: 1. Medial and lateral meniscus tears, right knee. 2. Chondromalacia, medial compartment, right knee. 3. Left knee medial meniscal tear. 4. Lateral meniscus tear, left knee. 5. Bilateral plica.  PRINCIPAL PROCEDURES: 1. Right knee partial medial and partial lateral meniscectomy. 2. Chondroplasty, medial femoral condyle down to bleeding bone. 3. Debridement of medial shelf plica back to the rim of the knee. 4. Left knee arthroscopy with partial medial and partial lateral     meniscectomy. 5. Medial shelf plica excision, left knee.  SURGEON:  [name], M.D.  ASSISTANT:  [name], P.A.  ANESTHESIA:  General.  BRIEF HISTORY:  Alice Williamson is a 59 year old female with history of significant complaints of bilateral knee pain.  She had been treated conservatively for prolonged period of time with injection therapy, activity modification, and after failure of all conservative care, she was taken to the operating room for bilateral knee arthroscopy. Preoperative MRIs of the right knee showed medial and lateral meniscus tearing and of the left knee showed a posterior horn medial meniscus tear.  We talked about staging the procedures, we talked about further conservative care and she elected to undergo bilateral knee arthroscopy. She was brought to the operating room for this procedure.  DESCRIPTION OF PROCEDURE:  The patient was taken to the operating room and after  adequate level of anesthesia was obtained with general anesthetic, the patient was placed supine on the operating table.  Both legs were prepped and draped in usual sterile fashion.  Following this, routine arthroscopic examination of the right knee revealed that there was a significant medial meniscal tear at the midbody and going down into the posterior aspect and the anterior aspect.  There were grade 3 and some grade 4 changes on the tibial plateau and medial femoral condyle.  These were debrided down to bleeding bone.  Attention was then turned to the ACL normal.  Attention was turned to the lateral side where there was outer edge meniscal tear, this was debrided with a straight biting forceps and the remaining rim was contoured down with the suction shaver.  Attention was turned back over to the medial side, there was a large draping medial plica, which had to be debrided to gain access into the medial compartment that was debrided back to the rim. Patellofemoral joint looked quite good.  At this point, the knee was copiously and thoroughly lavaged and suctioned dry, and 20 mL of 0.25% Marcaine was instilled in and around the portals and then to the knee for postoperative anesthesia.  Sterile compressive dressing was applied, and  attention was turned to the left knee.  Left knee arthroscopy was performed  in the routine fashion, going into the medial compartment.  There was a significant posterior horn medial meniscal tear, which was debrided back to the rim.  The medial femoral condyle had some grade 2 change, which was debrided, but not significantly.  Attention was turned to the ACL normal.  Attention was turned to the lateral femoral condyle normal.  Attention was turned to the lateral meniscus where there was some fraying in the outer edge similar to the opposite side.  This was debrided with a straight biting forceps and contoured with the suction shaver.  Large medial plica  was present on this side, which was debrided back to the capsular rim.  The patellofemoral joint looked good on this side.  At this point, the knee was copiously and thoroughly lavaged, suctioned dry.  The arthroscopic portals were closed with bandage.  A 20 mL of 0.25% Marcaine was instilled in the knee for postoperative anesthesia.  A sterile compressive dressing was applied.  The patient was taken to the recovery room where she was noted to be in satisfactory condition.  Estimated blood loss for the procedure was minimal.     [name], M.D.     Corliss Skains  D:  01/20/2015  T:  01/21/2015  Job:  [id number]

## 2015-09-04 ENCOUNTER — Encounter (HOSPITAL_BASED_OUTPATIENT_CLINIC_OR_DEPARTMENT_OTHER): Payer: Self-pay | Admitting: Emergency Medicine

## 2015-09-04 ENCOUNTER — Emergency Department (HOSPITAL_BASED_OUTPATIENT_CLINIC_OR_DEPARTMENT_OTHER): Payer: 59

## 2015-09-04 ENCOUNTER — Emergency Department (HOSPITAL_BASED_OUTPATIENT_CLINIC_OR_DEPARTMENT_OTHER)
Admission: EM | Admit: 2015-09-04 | Discharge: 2015-09-04 | Disposition: A | Discharge status: Home / Self Care | Payer: 59 | Attending: Emergency Medicine | Admitting: Emergency Medicine

## 2015-09-04 DIAGNOSIS — Z79899 Other long term (current) drug therapy: Secondary | ICD-10-CM | POA: Diagnosis not present

## 2015-09-04 DIAGNOSIS — I10 Essential (primary) hypertension: Secondary | ICD-10-CM | POA: Diagnosis not present

## 2015-09-04 DIAGNOSIS — R200 Anesthesia of skin: Secondary | ICD-10-CM | POA: Insufficient documentation

## 2015-09-04 DIAGNOSIS — R202 Paresthesia of skin: Secondary | ICD-10-CM | POA: Diagnosis not present

## 2015-09-04 DIAGNOSIS — H9319 Tinnitus, unspecified ear: Secondary | ICD-10-CM | POA: Diagnosis not present

## 2015-09-04 DIAGNOSIS — M5030 Other cervical disc degeneration, unspecified cervical region: Secondary | ICD-10-CM | POA: Insufficient documentation

## 2015-09-04 LAB — BASIC METABOLIC PANEL
ANION GAP: 8 (ref 5–15)
BUN: 13 mg/dL (ref 6–20)
CHLORIDE: 101 mmol/L (ref 101–111)
CO2: 26 mmol/L (ref 22–32)
CREATININE: 1.09 mg/dL — AB (ref 0.44–1.00)
Calcium: 9.3 mg/dL (ref 8.9–10.3)
GFR calc Af Amer: >60 mL/min (ref >60)
GFR calc non Af Amer: 55 mL/min — ABNORMAL LOW (ref >60)
Glucose, Bld: 92 mg/dL (ref 65–99)
Potassium: 3.4 mmol/L — ABNORMAL LOW (ref 3.5–5.1)
Sodium: 135 mmol/L (ref 135–145)

## 2015-09-04 MED ORDER — GADOBENATE DIMEGLUMINE 529 MG/ML IV SOLN: 16.0000 mL | Freq: Once | INTRAVENOUS | Status: DC | PRN

## 2015-09-04 NOTE — ED Provider Notes (Signed)
CSN: [id number]     Arrival date & time 09/04/15  1516 History  This chart was scribed for [name], MD by [name], [name]. This patient was seen in room MH01/MH01 and the patient's care was started at 4:03 PM.      Chief Complaint  Patient presents with  . Tingling  . Extremity Pain   Patient is a 59 y.o. female presenting with extremity pain. The history is provided by the patient. No language interpreter was used.  Extremity Pain This is a new problem. The current episode started more than 1 week ago. The problem occurs constantly. The problem has been gradually worsening. Pertinent negatives include no chest pain, no headaches and no shortness of breath. Nothing aggravates the symptoms. Relieved by: Alice Williamson. She has tried nothing for the symptoms.    HPI Comments: Alice Williamson is a 59 y.o. female who presents to the Emergency Department complaining of moderate, gradually worsening, constant tingling down posterior BUE (worse on the left) onset 3 weeks ago with associated calor of the BUE, lower back tingling, posterior RLE tingling, intermittent tingling in her mouth/lips, tinnitus. She states mild relief with Benadryl. Pt notes she has been evaluated by her PCP for the tinnitus. She notes she has not been evaluated by her PCP for the tingling. No new soaps, lotions, detergents, recent travel outside the country. She states a recent MRI of her knees 7 months ago. She denies rash, SOB, CP, dizziness, nausea, vomiting, HA, blurry vision, dysuria, difficulty urinating, decreased appetite, fever.     Past Medical History  Diagnosis Date  . Hypertension    Past Surgical History  Procedure Laterality Date  . Appendectomy    . Dilatation & currettage/hysteroscopy with resectocope N/A 10/29/2013    Procedure: DILATATION & CURETTAGE/HYSTEROSCOPY WITH RESECTION OF POLYP AND FIBROID;  Surgeon: [name], MD;  Location: [facility];  Service: Gynecology;  Laterality: N/A;  . Knee  arthroscopy with medial menisectomy Bilateral 01/20/2015    Procedure: KNEE ARTHROSCOPY WITH MEDIAL MENISECTOMY;  Surgeon: [name], MD;  Location: [facility];  Service: Orthopedics;  Laterality: Bilateral;  . Knee arthroscopy with lateral menisectomy Bilateral 01/20/2015    Procedure: KNEE ARTHROSCOPY WITH LATERAL MENISECTOMY;  Surgeon: [name], MD;  Location: [facility];  Service: Orthopedics;  Laterality: Bilateral;  . Chondroplasty Right 01/20/2015    Procedure: CHONDROPLASTY;  Surgeon: [name], MD;  Location: [facility];  Service: Orthopedics;  Laterality: Right;  . Knee arthroscopy with excision plica Bilateral 07/21/4195    Procedure: KNEE ARTHROSCOPY WITH EXCISION PLICA;  Surgeon: [name], MD;  Location: [facility];  Service: Orthopedics;  Laterality: Bilateral;   History reviewed. No pertinent family history. Social History  Substance Use Topics  . Smoking status: Never Smoker   . Smokeless tobacco: None  . Alcohol Use: Yes     Comment: sociaaly   OB History    No data available     Review of Systems  Constitutional: Negative for fever and appetite change.  HENT: Positive for tinnitus.   Eyes: Negative for visual disturbance.  Respiratory: Negative for shortness of breath.   Cardiovascular: Negative for chest pain.  Gastrointestinal: Negative for nausea.  Genitourinary: Negative for dysuria and difficulty urinating.  Skin: Negative for pallor.  Neurological: Negative for dizziness and headaches.       +tingling in posterior BUE, posterior right leg, lower back, mouth/lips +calor of the affected areas  All other systems reviewed and are negative.  Allergies  Codeine  Home Medications   Prior to Admission medications   Medication Sig Start Date End Date Taking? Authorizing Provider  acetaminophen (TYLENOL) 500 MG tablet Take 500 mg by mouth every 6 (six) hours as needed.    Historical Provider,  MD  diphenhydrAMINE (BENADRYL) 25 MG tablet Take 25 mg by mouth every 6 (six) hours as needed.    Historical Provider, MD  ibuprofen (ADVIL,MOTRIN) 800 MG tablet Take 1 tablet (800 mg total) by mouth every 8 (eight) hours as needed. 10/29/13   [name], MD  lisinopril-hydrochlorothiazide (PRINZIDE,ZESTORETIC) 10-12.5 MG per tablet Take 1 tablet by mouth daily.    Historical Provider, MD  oxyCODONE-acetaminophen (PERCOCET/ROXICET) 5-325 MG per tablet Take 1-2 tablets by mouth every 4 (four) hours as needed for severe pain. 01/20/15   [name], [name]   BP 137/75 mmHg  Pulse 68  Temp(Src) 98.1 F (36.7 C) (Oral)  Resp 18  Ht 5' 2.5" (1.588 m)  Wt 174 lb (78.926 kg)  BMI 31.30 kg/m2  SpO2 100% Physical Exam  Constitutional: She is oriented to person, place, and time. She appears well-developed and well-nourished. No distress.  HENT:  Head: Normocephalic and atraumatic.  Mouth/Throat: Oropharynx is clear and moist.  Eyes: EOM are normal. Pupils are equal, round, and reactive to light.  Neck: Normal range of motion. Neck supple.  Cardiovascular: Normal rate and regular rhythm.  Exam reveals no friction rub.   No murmur heard. Pulmonary/Chest: Effort normal and breath sounds normal. No respiratory distress. She has no wheezes. She has no rales.  Abdominal: Soft. She exhibits no distension. There is no tenderness. There is no rebound.  Musculoskeletal: Normal range of motion. She exhibits no edema.  Neurological: She is alert and oriented to person, place, and time. No cranial nerve deficit or sensory deficit. GCS eye subscore is 4. GCS verbal subscore is 5. GCS motor subscore is 6.  Normal differentiation between sharp and all throughout No altered light touch in bilateral upper extremities  Skin: She is not diaphoretic.  Nursing note and vitals reviewed.   ED Course  Procedures (including critical care time) DIAGNOSTIC STUDIES: Oxygen Saturation is 100% on RA, normal by my  interpretation.    COORDINATION OF CARE: 4:13 PM Discussed treatment plan with pt at bedside and pt agreed to plan.   Labs Review Labs Reviewed - No data to display  Imaging Review Mr [name] Contrast  09/04/2015   CLINICAL DATA:  Bilateral 10 otitis, more severe today. Some symptoms for 2 years. Tingling in both upper arms.  EXAM: MRI HEAD WITHOUT AND WITH CONTRAST  TECHNIQUE: Multiplanar, multiecho pulse sequences of the brain and surrounding structures were obtained without and with intravenous contrast.  CONTRAST:  16 cc MultiHance  COMPARISON:  Head CT 06/12/2005  FINDINGS: Diffusion imaging does not show any acute or subacute infarction. The brainstem and cerebellum are normal. The cerebral hemispheres show areas of FLAIR and T2 signal within the white matter most consistent with chronic small vessel disease. No cortical or large vessel territory infarction. No mass lesion, hemorrhage, hydrocephalus or extra-axial collection.  CP angle regions appear normal. Seventh and eighth nerve complexes appear normal bilaterally. No vestibular schwannoma. No evidence of vascular lesion. No fluid in the sinuses, middle ears or mastoids.  IMPRESSION: No cause of the presenting symptoms is identified.  Mild chronic small-vessel ischemic change within the cerebral hemispheric white matter.  This case was not  flagged as a stat exam, therefore there was slight delay in interpretation.   Electronically Signed   By: Nelson Chimes M.D.   On: 09/04/2015 18:55   Mr Cervical Spine W Wo Contrast  09/04/2015   CLINICAL DATA:  Bilateral tinnitus which is chronic but acutely more severe today. Tingling in both upper arms.  EXAM: MRI CERVICAL SPINE WITHOUT AND WITH CONTRAST  TECHNIQUE: Multiplanar and multiecho pulse sequences of the cervical spine, to include the craniocervical junction and cervicothoracic junction, were obtained according to standard protocol without and with intravenous contrast.  CONTRAST:  16 cc  MultiHance  COMPARISON:  06/13/2005  FINDINGS: The foramen magnum is widely patent. There is ordinary mild osteoarthritis of the C1-2 articulation but no encroachment upon the neural spaces.  C2-3:  Normal interspace.  C3-4: Spondylosis with endplate osteophytes and bulging of the disc. Mild narrowing of the ventral subarachnoid space but no compression of the cord. Mild foraminal encroachment by osteophytes without apparent neural compression.  C4-5: Spondylosis with endplate osteophytes and shallow broad-based protrusion of disc material. Effacement of the ventral subarachnoid space but no compression of the cord. AP diameter of the canal is 8 mm. Foraminal encroachment by osteophytes bilaterally could possibly affect the C5 nerve roots.  C5-6: Spondylosis with endplate osteophytes and broad-based herniation of disc material. Effacement of the subarachnoid space surrounding the cord. AP diameter of the canal only 6 mm. Slight indentation of the cord. Foraminal stenosis bilaterally that could affect either or both C6 nerve roots.  C6-7: Spondylosis with endplate osteophytes and bulging of the disc. Narrowing of the ventral subarachnoid space but no compression of the cord. Foraminal narrowing bilaterally that could affect either or both C7 nerve roots.  C7-T1: Mild facet degeneration.  No canal or foraminal stenosis.  Since the previous study, the degenerative changes have worsened at each level.  IMPRESSION: Worsening of degenerative spondylosis from C3-4 through C6-7. Narrowing of the canal in that region, most pronounced at C5-6 where the AP diameter is only 6 mm. The subarachnoid space is effaced at that level on the cord is flattened slightly. No abnormal cord signal. AP diameter of the canal is 8 mm at C4-5 and 8-9 mm at C6-7.  Foraminal stenosis that could affect the exiting nerve roots at C4-5, C5-6 and C6-7.   Electronically Signed   By: Nelson Chimes M.D.   On: 09/04/2015 19:00   I have personally  reviewed and evaluated these images and lab results as part of my medical decision-making.   EKG Interpretation None      MDM   Final diagnoses:  Paresthesia  Bilateral numbness and tingling of arms and legs  Degenerative disc disease, cervical    59 year old female here with intermittent burning and tingling sensation on the backs of bilateral upper arms, left greater than right. She's also had it on her right posterior leg for the past several days. This has been present for the past 2 weeks. She denies any recent new irritants. Shows little to no relief with Benadryl. Here vitals are stable. She is well-appearing. She has a normal neurologic exam and can differently between sharp and dull in all extremities. Has normal strength and sensation. She has normal reflexes. I doubt this is Guillian-Barre since she has reflexes. WilL MRI her brain and neck for possible demyelinating disease.  MR shows some worsening degenerative disease with some effacement of the subarachnoid space and flattening of the spinal cord but no abnormal cord signal. With such  mild and intermittent paresthesias and normal exam here, do not feel she needs stat neurosurgical consultation. Given neurosurgery follow-up for discharge.  I personally performed the services described in this documentation, which was scribed in my presence. The recorded information has been reviewed and is accurate.     [name], MD 09/04/15 [phone]

## 2015-09-04 NOTE — ED Notes (Addendum)
Pt in c/o intermittent burning and tingling sensations to bilateral arms and R lower back and R posterior leg x more than 2 weeks. Denies CP, SOB, N/V/D, dizziness, or lightheadedness. NAD noted in triage.

## 2015-09-04 NOTE — Discharge Instructions (Signed)

## 2016-08-27 IMAGING — MR MR HEAD WO/W CM
14 series · 48 of 48 positions shown · IV contrast (16ML MULTIHANCE)
Comparison: Head CT 06/12/2005

CLINICAL DATA: Bilateral 10 otitis, more severe today. Some
symptoms for 2 years. Tingling in both upper arms.

EXAM:
MRI HEAD WITHOUT AND WITH CONTRAST
TECHNIQUE: Multiplanar, multiecho pulse sequences of the brain and surrounding
structures were obtained without and with intravenous contrast.
CONTRAST:  16 cc MultiHance

[Series 4: T1 · sagittal · 5.0mm · 0.90mm/px · 3 of 23 slices shown (1 of 5)]
[im 1/23]
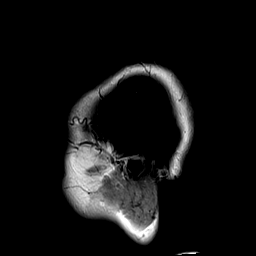
[im 12/23]
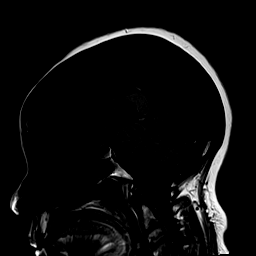
[im 23/23]
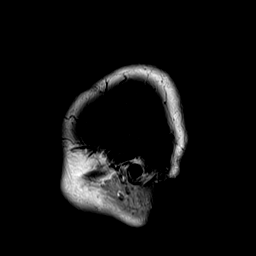

[Series 5: DWI · axial · 5.0mm · 1.80mm/px · z∈[-65,+78]mm · 9 of 90 slices shown (1 of 5)]
[im 1/90]
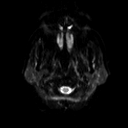
[im 12/90]
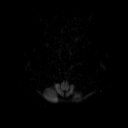
[im 23/90]
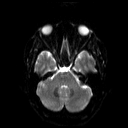
[im 34/90]
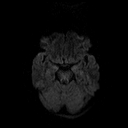
[im 45/90]
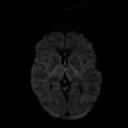
[im 56/90]
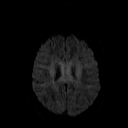
[im 67/90]
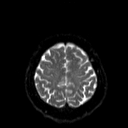
[im 78/90]
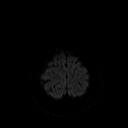
[im 90/90]
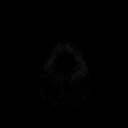

[Series 6: DWI · axial · 5.0mm · 1.80mm/px · z∈[-65,+78]mm · 4 of 45 slices shown (2 of 5)]
[im 1/45]
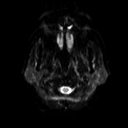
[im 15/45]
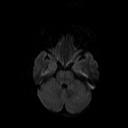
[im 30/45]
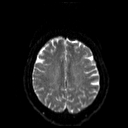
[im 45/45]
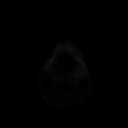

[Series 7: DWI · axial · 5.0mm · 1.80mm/px · z∈[-65,+78]mm · 2 of 23 slices shown (3 of 5)]
[im 1/23]
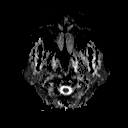
[im 23/23]
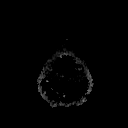

[Series 8: T2 · axial · 5.0mm · 0.51mm/px · z∈[-92,+42]mm · 2 of 24 slices shown (1 of 2)]
[im 1/24]
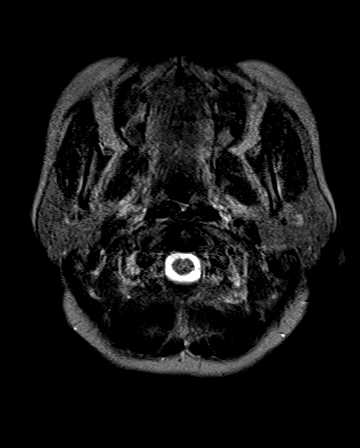
[im 24/24]
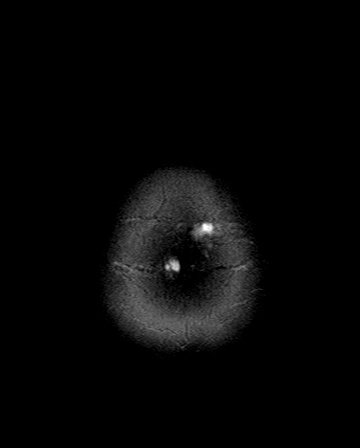

[Series 10: FLAIR · axial · 5.0mm · 0.90mm/px · z∈[-92,+42]mm · 2 of 24 slices shown]
[im 1/24]
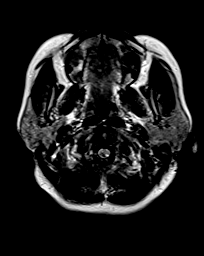
[im 24/24]
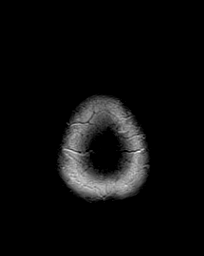

[Series 11: T2 · axial · 1.0mm · 0.35mm/px · z∈[-78,-40]mm · 4 of 40 slices shown (2 of 2)]
[im 1/40]
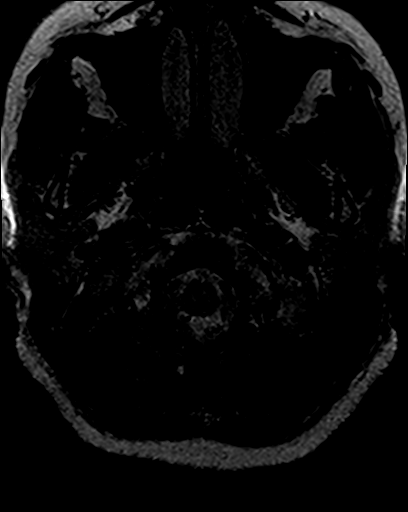
[im 14/40]
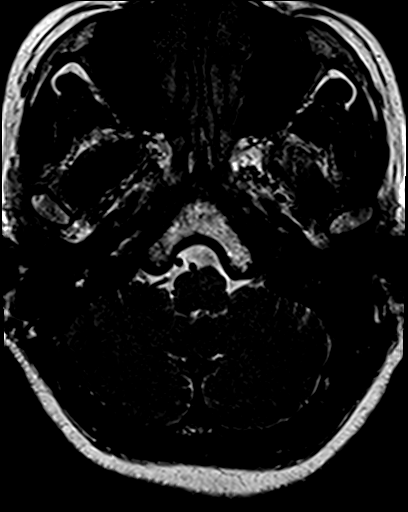
[im 27/40]
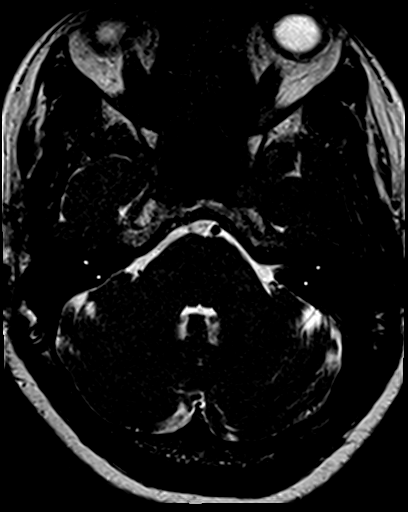
[im 40/40]
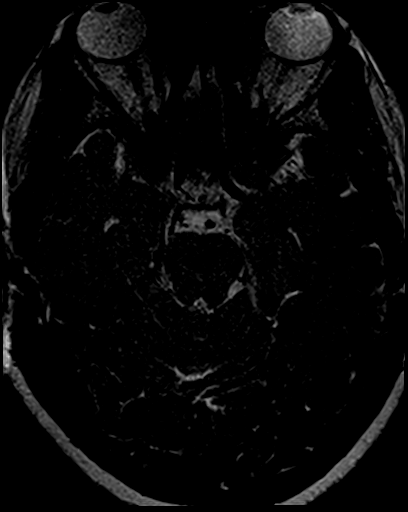

[Series 12: T1 · axial · 3.0mm · 0.62mm/px · 1 of 15 slices shown (2 of 5)]
[im 1/15]
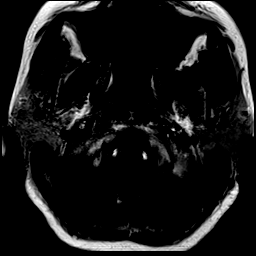

[Series 13: DWI · coronal · 3.0mm · 1.46mm/px · 8 of 88 slices shown (4 of 5)]
[im 1/88]
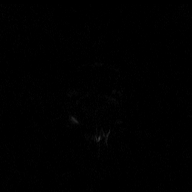
[im 13/88]
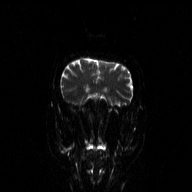
[im 25/88]
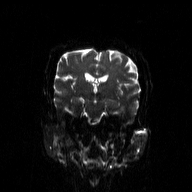
[im 38/88]
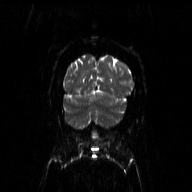
[im 50/88]
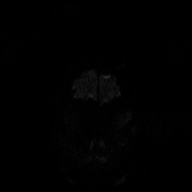
[im 63/88]
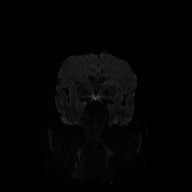
[im 75/88]
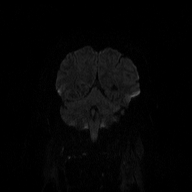
[im 88/88]
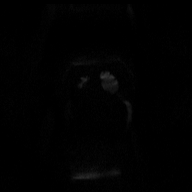

[Series 14: DWI · coronal · 3.0mm · 1.46mm/px · 4 of 45 slices shown (5 of 5)]
[im 1/45]
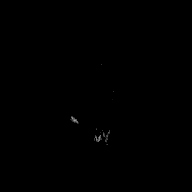
[im 15/45]
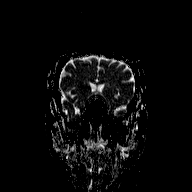
[im 30/45]
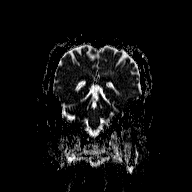
[im 45/45]
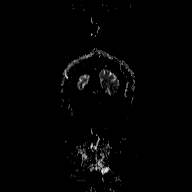

[Series 15: T1 · coronal · 3.0mm · 0.31mm/px · 2 of 23 slices shown (3 of 5)]
[im 1/23]
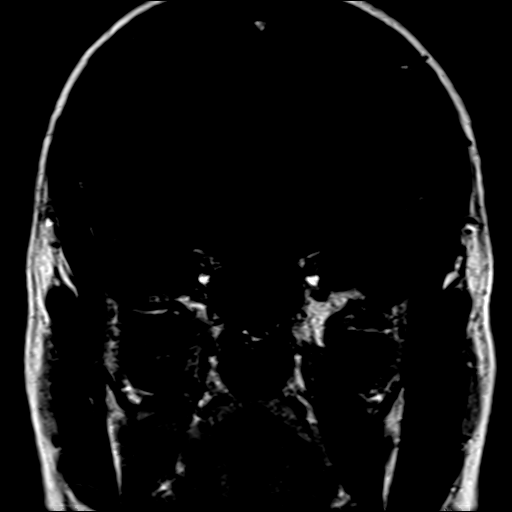
[im 23/23]
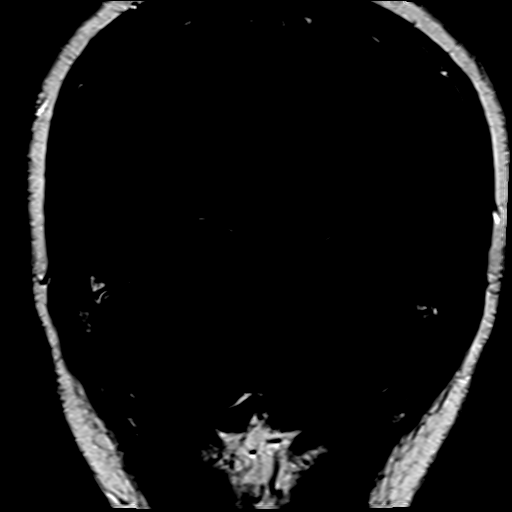

[Series 16: T1 · axial · 3.0mm · 0.31mm/px · 1 of 15 slices shown (4 of 5)]
[im 1/15]
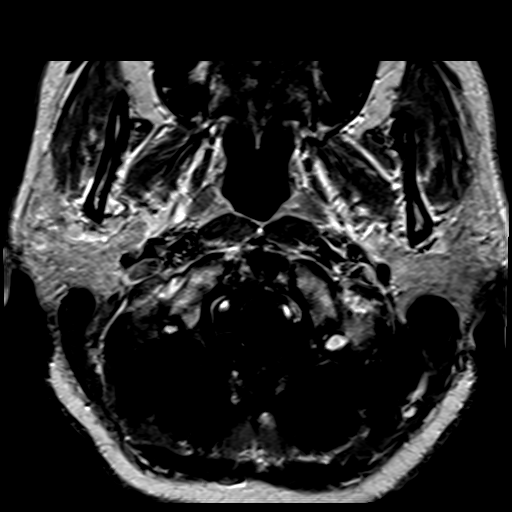

[Series 17: T1 · coronal · 3.0mm · 0.31mm/px · 2 of 23 slices shown (5 of 5)]
[im 1/23]
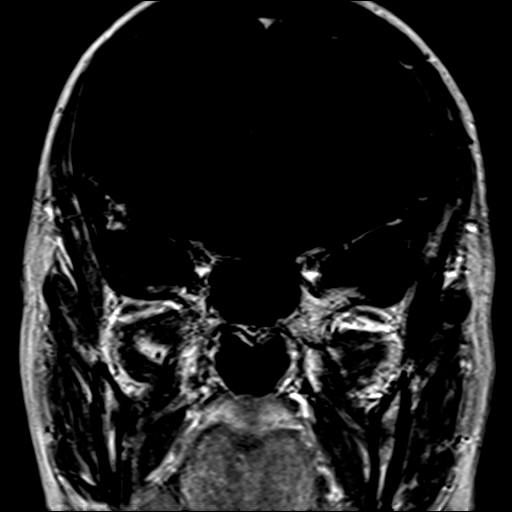
[im 23/23]
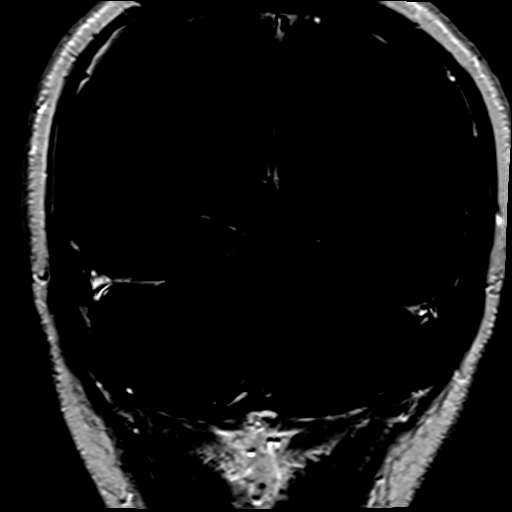

[Series 18: T1 post-contrast · axial · 3.0mm · 0.45mm/px · z∈[-93,+44]mm · 4 of 48 slices shown]
[im 1/48]
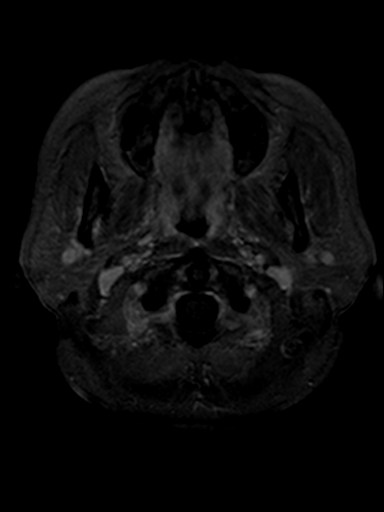
[im 16/48]
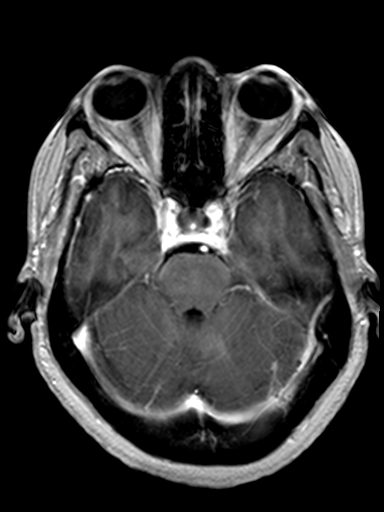
[im 32/48]
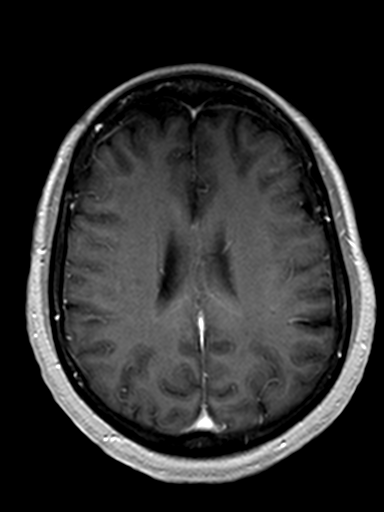
[im 48/48]
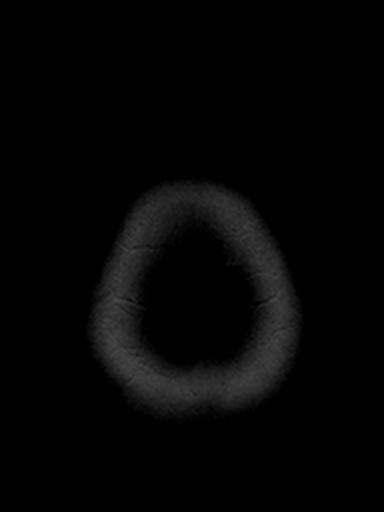

[48 of 48 positions shown; findings below may reference images not displayed]

FINDINGS: Diffusion imaging does not show any acute or subacute infarction.
The brainstem and cerebellum are normal. The cerebral hemispheres
show areas of FLAIR and T2 signal within the white matter most
consistent with chronic small vessel disease. No cortical or large
vessel territory infarction. No mass lesion, hemorrhage,
hydrocephalus or extra-axial collection.

CP angle regions appear normal. Seventh and eighth nerve complexes
appear normal bilaterally. No vestibular schwannoma. No evidence of
vascular lesion. No fluid in the sinuses, middle ears or mastoids.
IMPRESSION: No cause of the presenting symptoms is identified.

Mild chronic small-vessel ischemic change within the cerebral
hemispheric white matter.

This case was not flagged as a stat exam, therefore there was slight
delay in interpretation.

## 2019-11-24 ENCOUNTER — Emergency Department (HOSPITAL_BASED_OUTPATIENT_CLINIC_OR_DEPARTMENT_OTHER)
Admission: EM | Admit: 2019-11-24 | Discharge: 2019-11-25 | Disposition: A | Discharge status: Home / Self Care | Payer: BLUE CROSS/BLUE SHIELD | Attending: Emergency Medicine | Admitting: Emergency Medicine

## 2019-11-24 ENCOUNTER — Encounter (HOSPITAL_BASED_OUTPATIENT_CLINIC_OR_DEPARTMENT_OTHER): Payer: Self-pay | Admitting: *Deleted

## 2019-11-24 ENCOUNTER — Other Ambulatory Visit: Payer: Self-pay

## 2019-11-24 DIAGNOSIS — U071 COVID-19: Secondary | ICD-10-CM | POA: Diagnosis not present

## 2019-11-24 DIAGNOSIS — Z87891 Personal history of nicotine dependence: Secondary | ICD-10-CM | POA: Insufficient documentation

## 2019-11-24 DIAGNOSIS — Z79899 Other long term (current) drug therapy: Secondary | ICD-10-CM | POA: Diagnosis not present

## 2019-11-24 DIAGNOSIS — I10 Essential (primary) hypertension: Secondary | ICD-10-CM | POA: Insufficient documentation

## 2019-11-24 DIAGNOSIS — B349 Viral infection, unspecified: Secondary | ICD-10-CM

## 2019-11-24 DIAGNOSIS — R509 Fever, unspecified: Secondary | ICD-10-CM | POA: Diagnosis present

## 2019-11-24 NOTE — ED Provider Notes (Signed)
TIME SEEN: 11:55 PM  CHIEF COMPLAINT: Fevers, body ache, nausea  HPI: Patient is a 63 year old female with history of hypertension who presents to the emergency department with fevers, body aches, nausea that started December 3.  Has had a mild headache but no neck pain or neck stiffness.  No chest pain, shortness of breath, cough.  No sore throat or ear pain.  No vomiting, diarrhea, abdominal pain.  No dysuria or hematuria.  No rash.  No known sick contacts.  No Covid exposures.  Has not had an influenza vaccination.  States she is having a hard time sleeping as well because she feels poorly.  ROS: See HPI Constitutional:  fever  Eyes: no drainage  ENT: no runny nose   Cardiovascular:  no chest pain  Resp: no SOB  GI: no vomiting GU: no dysuria Integumentary: no rash  Allergy: no hives  Musculoskeletal: no leg swelling  Neurological: no slurred speech ROS otherwise negative  PAST MEDICAL HISTORY/PAST SURGICAL HISTORY:  Past Medical History:  Diagnosis Date  . Hypertension     MEDICATIONS:  Prior to Admission medications   Medication Sig Start Date End Date Taking? Authorizing Provider  acetaminophen (TYLENOL) 500 MG tablet Take 500 mg by mouth every 6 (six) hours as needed.    [provider]  diphenhydrAMINE (BENADRYL) 25 MG tablet Take 25 mg by mouth every 6 (six) hours as needed.    [provider]  ibuprofen (ADVIL,MOTRIN) 800 MG tablet Take 1 tablet (800 mg total) by mouth every 8 (eight) hours as needed. 10/29/13   [name], MD  lisinopril-hydrochlorothiazide (PRINZIDE,ZESTORETIC) 10-12.5 MG per tablet Take 1 tablet by mouth daily.    [provider]  oxyCODONE-acetaminophen (PERCOCET/ROXICET) 5-325 MG per tablet Take 1-2 tablets by mouth every 4 (four) hours as needed for severe pain. 01/20/15   [name], [name]    ALLERGIES:  Allergies  Allergen Reactions  . Codeine Nausea And Vomiting    SOCIAL HISTORY:  Social History    Tobacco Use  . Smoking status: Former Research scientist (life sciences)  . Smokeless tobacco: Never Used  Substance Use Topics  . Alcohol use: Yes    Comment: sociaaly    FAMILY HISTORY: History reviewed. No pertinent family history.  EXAM: BP 127/90 (BP Location: Left Arm)   Pulse 67   Temp 98.4 F (36.9 C)   Resp 18   Ht 5\' 2"  (1.575 m)   Wt 81.6 kg   SpO2 97%   BMI 32.92 kg/m  CONSTITUTIONAL: Alert and oriented and responds appropriately to questions. Well-appearing; well-nourished, appears well-hydrated, nontoxic-appearing HEAD: Normocephalic EYES: Conjunctivae clear, pupils appear equal, EOM appear intact ENT: normal nose; moist mucous membranes NECK: Supple, normal ROM CARD: RRR; S1 and S2 appreciated; no murmurs, no clicks, no rubs, no gallops RESP: Normal chest excursion without splinting or tachypnea; breath sounds clear and equal bilaterally; no wheezes, no rhonchi, no rales, no hypoxia or respiratory distress, speaking full sentences ABD/GI: Normal bowel sounds; non-distended; soft, non-tender, no rebound, no guarding, no peritoneal signs, no hepatosplenomegaly BACK:  The back appears normal EXT: Normal ROM in all joints; no deformity noted, no edema; no cyanosis SKIN: Normal color for age and race; warm; no rash on exposed skin NEURO: Moves all extremities equally PSYCH: The patient's mood and manner are appropriate.   MEDICAL DECISION MAKING: Patient here with fevers, body aches, nausea.  Symptoms suggestive of viral illness.  No signs of bacterial infection currently.  Hemodynamically stable at this time and does  not appear toxic, septic.  Recommended supportive care at home.  Will send outpatient Covid and influenza testing.  She is outside of treatment window for Tamiflu or Xofluza.  Will give Zofran for nausea.  Will discharge with prescription of the same.  Recommend over-the-counter medications such as Benadryl or Unisom to take as needed for sleep.  I feel she is safe for discharge  home.  I do not feel she needs antibiotics or further emergent work-up at this time.  Patient and husband are comfortable with this plan.  She has been advised to quarantine for 10 full days after the onset of symptoms.   At this time, I do not feel there is any life-threatening condition present. I have reviewed, interpreted and discussed all results (EKG, imaging, lab, urine as appropriate) and exam findings with patient/family. I have reviewed nursing notes and appropriate previous records.  I feel the patient is safe to be discharged home without further emergent workup and can continue workup as an outpatient as needed. Discussed usual and customary return precautions. Patient/family verbalize understanding and are comfortable with this plan.  Outpatient follow-up has been provided as needed. All questions have been answered.       ANTANAE KOEPP was evaluated in Emergency Department on 11/24/2019 for the symptoms described in the history of present illness. She was evaluated in the context of the global COVID-19 pandemic, which necessitated consideration that the patient might be at risk for infection with the SARS-CoV-2 virus that causes COVID-19. Institutional protocols and algorithms that pertain to the evaluation of patients at risk for COVID-19 are in a state of rapid change based on information released by regulatory bodies including the CDC and federal and state organizations. These policies and algorithms were followed during the patient's care in the ED.  Patient was seen wearing N95, face shield, gloves, gown.    EKG Interpretation  Date/Time:  Monday November 24 2019 23:34:20 EST Ventricular Rate:  70 PR Interval:    QRS Duration: 93 QT Interval:  408 QTC Calculation: 441 R Axis:   90 Text Interpretation: Sinus rhythm Borderline right axis deviation Low voltage, precordial leads Nonspecific diffuse T wave abnormalities Confirmed by Pryor Curia [phone]) on 11/24/2019 11:36:30  PM         , Delice Bison, DO 11/25/19 [id number]

## 2019-11-24 NOTE — ED Triage Notes (Signed)
Pt c/o fever and body aches x 4 days. PTA tylenol 2200

## 2019-11-25 LAB — INFLUENZA PANEL BY PCR (TYPE A & B)
Influenza A By PCR: NEGATIVE
Influenza B By PCR: NEGATIVE

## 2019-11-25 MED ORDER — ONDANSETRON 4 MG PO TBDP
4.0000 mg | ORAL_TABLET | Freq: Four times a day (QID) | ORAL | 0 refills | Status: AC | PRN
Start: 2019-11-25 — End: ?

## 2019-11-25 MED ORDER — ONDANSETRON 4 MG PO TBDP
4.0000 mg | ORAL_TABLET | Freq: Once | ORAL | Status: AC
  Administered 2019-11-25: 4 mg via ORAL
  Filled 2019-11-25: qty 1

## 2019-11-25 NOTE — Discharge Instructions (Addendum)
You may alternate Tylenol 1000 mg every 6 hours as needed for fever and pain and ibuprofen 800 mg every 8 hours as needed for fever and pain. Please rest and drink plenty of fluids. This is a viral illness causing your symptoms. You do not need antibiotics for a virus. You may use over-the-counter nasal saline spray and Afrin nasal saline spray as needed for nasal congestion. Please do not use Afrin for more than 3 days in a row. You may use guaifenesin and dextromethorphan as needed for cough.  You may use lozenges and Chloraseptic spray to help with sore throat.  Warm salt water gargles can also help with sore throat.  You may use over-the-counter Unisom (doxyalamine) or Benadryl to help with sleep.  Please note that some combination medicines such as DayQuil and NyQuil have multiple medications in them.  Please make sure you look at all labels to ensure that you are not taking too much of any one particular medication.  Symptoms from a virus may take 7-14 days to run its course.  There are medications that can treat the flu but at this time you are outside the treatment window for these medications.  Tamiflu and Xofluza have to be taken within the first 48 hours of symptoms.    You may follow-up on the results of your flu testing and Covid testing through [facility].  If you are positive for Covid, you will be contacted.    Please quarantine for 10 days after the onset of symptoms.  You will need to be fever free without using Tylenol or Ibuprofen for 3 full days AND your symptoms will need to be significantly improving or resolved (other than the loss of taste and smell which can last up to 3 months) before you can come out of quarantine.  You should isolate from others that you live with as well.  Any close contacts that have seen you in the past 14 days before your symptoms have started will need to be notified and they will need to quarantine for 14 full days even if they have a negative COVID  test.  COVID-19 is a viral illness and currently there are no specific outpatient treatments other than supportive measures such as alternating Tylenol and Ibuprofen, rest, increased fluid intake.  You do not need antibiotics.  If you develop chest pain, difficulty breathing, have blue lips or fingertips, begin vomiting and can not stop and can not hold down fluids, feel like you may pass out or you do pass out, have confusion, please return to the emergency department.

## 2019-11-26 LAB — NOVEL CORONAVIRUS, NAA (HOSP ORDER, SEND-OUT TO REF LAB; TAT 18-24 HRS): SARS-CoV-2, NAA: DETECTED — AB

## 2019-11-28 ENCOUNTER — Telehealth: Payer: Self-pay | Admitting: Nurse Practitioner

## 2019-11-28 NOTE — Telephone Encounter (Signed)
Called to Discuss with patient about Covid symptoms and the use of bamlanivimab, a monoclonal antibody infusion for those with mild to moderate Covid symptoms and at a high risk of hospitalization.     Pt is qualified for this infusion at the [facility] due to co-morbid conditions and/or a member of an at-risk group.     Unable to reach pt  

## 2023-04-01 DIAGNOSIS — K047 Periapical abscess without sinus: Secondary | ICD-10-CM | POA: Diagnosis not present

## 2023-04-24 DIAGNOSIS — R7303 Prediabetes: Secondary | ICD-10-CM | POA: Diagnosis not present

## 2023-04-24 DIAGNOSIS — Z0000 Encounter for general adult medical examination without abnormal findings: Secondary | ICD-10-CM | POA: Diagnosis not present

## 2023-04-24 DIAGNOSIS — E785 Hyperlipidemia, unspecified: Secondary | ICD-10-CM | POA: Diagnosis not present

## 2023-04-24 DIAGNOSIS — I10 Essential (primary) hypertension: Secondary | ICD-10-CM | POA: Diagnosis not present

## 2023-07-12 DIAGNOSIS — J209 Acute bronchitis, unspecified: Secondary | ICD-10-CM | POA: Diagnosis not present

## 2023-08-27 DIAGNOSIS — R220 Localized swelling, mass and lump, head: Secondary | ICD-10-CM | POA: Diagnosis not present

## 2023-09-02 DIAGNOSIS — R051 Acute cough: Secondary | ICD-10-CM | POA: Diagnosis not present

## 2023-09-02 DIAGNOSIS — R509 Fever, unspecified: Secondary | ICD-10-CM | POA: Diagnosis not present

## 2023-09-02 DIAGNOSIS — U071 COVID-19: Secondary | ICD-10-CM | POA: Diagnosis not present

## 2023-09-02 DIAGNOSIS — R0981 Nasal congestion: Secondary | ICD-10-CM | POA: Diagnosis not present

## 2023-09-02 DIAGNOSIS — R110 Nausea: Secondary | ICD-10-CM | POA: Diagnosis not present

## 2023-10-23 DIAGNOSIS — R051 Acute cough: Secondary | ICD-10-CM | POA: Diagnosis not present

## 2024-04-28 DIAGNOSIS — R7303 Prediabetes: Secondary | ICD-10-CM | POA: Diagnosis not present

## 2024-04-28 DIAGNOSIS — E785 Hyperlipidemia, unspecified: Secondary | ICD-10-CM | POA: Diagnosis not present

## 2024-04-28 DIAGNOSIS — I10 Essential (primary) hypertension: Secondary | ICD-10-CM | POA: Diagnosis not present

## 2024-04-28 DIAGNOSIS — E2839 Other primary ovarian failure: Secondary | ICD-10-CM | POA: Diagnosis not present

## 2024-04-28 DIAGNOSIS — Z0000 Encounter for general adult medical examination without abnormal findings: Secondary | ICD-10-CM | POA: Diagnosis not present

## 2024-05-06 DIAGNOSIS — K5904 Chronic idiopathic constipation: Secondary | ICD-10-CM | POA: Diagnosis not present

## 2024-05-06 DIAGNOSIS — Z1211 Encounter for screening for malignant neoplasm of colon: Secondary | ICD-10-CM | POA: Diagnosis not present

## 2024-05-17 DIAGNOSIS — E785 Hyperlipidemia, unspecified: Secondary | ICD-10-CM | POA: Diagnosis not present

## 2024-05-17 DIAGNOSIS — I10 Essential (primary) hypertension: Secondary | ICD-10-CM | POA: Diagnosis not present

## 2024-07-02 DIAGNOSIS — Z1211 Encounter for screening for malignant neoplasm of colon: Secondary | ICD-10-CM | POA: Diagnosis not present

## 2024-07-02 DIAGNOSIS — K5730 Diverticulosis of large intestine without perforation or abscess without bleeding: Secondary | ICD-10-CM | POA: Diagnosis not present

## 2024-07-17 DIAGNOSIS — K120 Recurrent oral aphthae: Secondary | ICD-10-CM | POA: Diagnosis not present

## 2024-08-04 DIAGNOSIS — H52223 Regular astigmatism, bilateral: Secondary | ICD-10-CM | POA: Diagnosis not present
